# Patient Record
Sex: Male | Born: 1980 | Race: White | Hispanic: No | Marital: Married
Health system: Western US, Academic
[De-identification: ages and names within clinical notes are randomized; demographics above are authoritative.]

---

## 2017-04-09 ENCOUNTER — Ambulatory Visit: Admit: 2017-04-09 | Discharge: 2017-04-09 | Payer: BLUE CROSS/BLUE SHIELD

## 2017-04-09 DIAGNOSIS — Z0000 Encounter for general adult medical examination without abnormal findings: Secondary | ICD-10-CM

## 2017-04-09 DIAGNOSIS — E785 Hyperlipidemia, unspecified: Secondary | ICD-10-CM

## 2017-04-09 LAB — CHOLESTEROL, LDL (INCL. TOT. A
Chol HDL Ratio: 3.2 (ref <6.0)
Cholesterol, HDL: 55 mg/dL (ref >39)
Cholesterol, Total: 178 mg/dL (ref <200)
LDL Cholesterol: 113 mg/dL (ref <130)
Non HDL Cholesterol: 123 mg/dL (ref <160)
Triglycerides, serum: 49 mg/dL (ref <200)

## 2017-04-09 MED ORDER — MULTIVITAMIN TABLET
ORAL | Status: AC
Start: 2017-04-09 — End: ?

## 2017-04-09 NOTE — Patient Instructions (Addendum)
Thank you for coming in to your visit today.      I would like to suggest that you review your instructions carefully and keep a file with all of your medical documents so that you can refer to them later and keep up to date with your care.     Special Instructions: none    You will receive a brief explanation from me within 1 week about any tests that I order and we will have an opportunity to discuss these more fully at your next appointment.     Your feedback allows us to improve. Your positive comments are also very helpful. We appreciate receiving your completed after-visit survey.    Thank you for allowing me to participate in your medical care.     Best wishes,  Cecilia A. Florio MD

## 2017-04-09 NOTE — Progress Notes (Signed)
Looks perfect! Great to meet you.
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
## 2017-04-10 NOTE — Progress Notes (Signed)
Immunization record received from [facility], pt is showing due for Tdap, no record of it, pls see pended, can offer pt a nurse visit appt.     Should I remove this order? Pls advise, Thanks!

## 2017-05-13 MED ORDER — DIPHTH,PERTUSSIS(ACEL),TETANUS 2.5 LF UNIT-8 MCG-5 LF/0.5ML IM SYRINGE
Freq: Once | INTRAMUSCULAR | Status: AC
Start: 2017-05-13 — End: ?

## 2017-10-29 ENCOUNTER — Ambulatory Visit: Admit: 2017-10-29 | Discharge: 2017-10-29 | Payer: BLUE CROSS/BLUE SHIELD

## 2017-10-29 DIAGNOSIS — R079 Chest pain, unspecified: Secondary | ICD-10-CM

## 2017-10-29 NOTE — Telephone Encounter (Signed)
Patient seen in clinic today

## 2017-10-29 NOTE — Progress Notes (Signed)
I performed EKG tracing on the patient during today's visit at [facility]. This documentation is intended to alert coders that the technical portion of EKG tracing was performed at [facility] today.
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
## 2017-11-06 LAB — ECG 12-LEAD
Atrial Rate: 52 {beats}/min
Calculated P Axis: 72 degrees
Calculated R Axis: 72 degrees
Calculated T Axis: 49 degrees
P-R Interval: 142 ms
QRS Duration: 86 ms
QT Interval: 396 ms
QTcb: 368 ms
Ventricular Rate: 52 {beats}/min

## 2017-11-10 ENCOUNTER — Ambulatory Visit: Admit: 2017-11-10 | Discharge: 2017-11-10 | Payer: BLUE CROSS/BLUE SHIELD

## 2017-11-10 DIAGNOSIS — R079 Chest pain, unspecified: Secondary | ICD-10-CM

## 2017-11-10 DIAGNOSIS — R0789 Other chest pain: Secondary | ICD-10-CM
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
## 2017-11-16 LAB — ECG 12-LEAD
Atrial Rate: 60 {beats}/min
Calculated P Axis: 67 degrees
Calculated R Axis: 71 degrees
Calculated T Axis: 50 degrees
P-R Interval: 144 ms
QRS Duration: 90 ms
QT Interval: 390 ms
QTcb: 390 ms
Ventricular Rate: 60 {beats}/min

## 2017-11-16 LAB — ECG STRESS REPORT
Max Diastolic BP: 44 mmHg
Max Heart Rate: 176 {beats}/min
Max Predicted Heart Rate: 185 {beats}/min
Max Work Load (METS*10): 163
Max. Systolic BP: 150 mmHg

## 2019-10-07 IMAGING — CT CTA Coronary (with FFR if indicated)
1 of 18 series · 1 of 18 positions shown, 2 images · IV contrast (omnipaque)
Comparison: None available

CTA Coronary (with FFR if indicated)
INDICATION: Family history of premature coronary heart disease Family history            
 of premature coronary heart disease.                                                      
 Pertinent History: Complaints of left sided chest pain                                    
 Surgical History:                                                                         
 Cancer: None                                                                              
 GFR (past 30 days): >39 ml/min                                                            
 Metformin: None                                                                           
 Smoking status: Never                                                                     
 Intravenous contrast: 100 mL Omnipaque 350                                                
 Technologist Comments: None
TECHNIQUE: Gated computed tomographic angiography; empiric delay for contrast             
 timing; 3D angiographic MIP (maximum intensity projection) reconstructions;               
 sagittal and coronal reformats.                                                           
 Utilized dose reduction techniques include: Automated Exposure Control, vendor            
 specific iterative reconstruction technique

[Series 6: retro ccta bestdiast 77 % · axial · 0.31mm/px · z∈[+1646,+1646]mm · 1 of 466 slices shown, 2 images]
[im 1/466  soft-tissue]
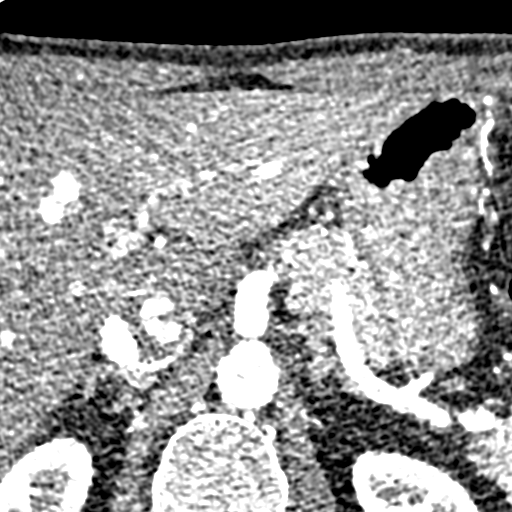
[im 1/466  lung]
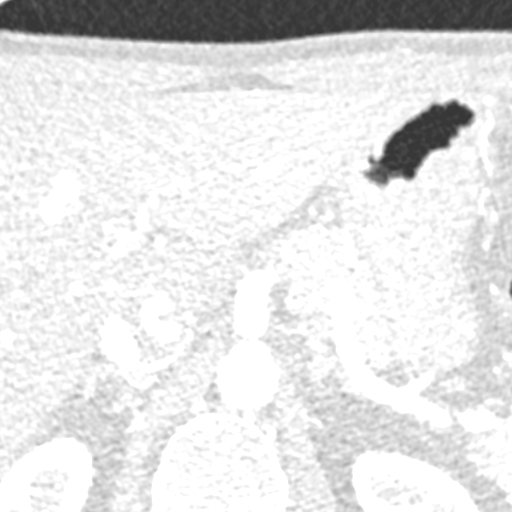

[1 of 18 positions shown; findings below may reference images not displayed]

FINDINGS: The superior and inferior aspects of the chest are incompletely imaged          
 on this coronary CTA.  There is no evidence for aortic aneurysm or aortic                 
 dissection.  No anomalous coronary arteries.  There is a trileaflet aortic                
 valve.  No filling defects are seen within the atria or ventricles.  No                   
 chamber                                                                                   
 enlargement or LVH.  No definite atrial or ventricular septal defects.  There is          
 a conventional pulmonary vein branching pattern.  No pulmonary emboli.  No                
 pulmonary arterial enlargement.  No pleural or pericardial effusion.  No                  
 evidence for pneumothorax.  The visualized portions of the lungs are clear.               
 Suspected to mixing artifact within the inferior vena cava.  The partially                
 imaged celiac artery is a large vessel which could be a developmental variant             
 from combined celiacomesenteric trunk.                                                    
 End-diastolic volume 119 normal range 67-155 mL                                           
 End-systolic volume 51 normal range 22-58 mL                                              
 Stroke-volume 67 normal range 72-110 mL                                                   
 Ejection fraction: 57% normal range 56-78%                                                
 Left main coronary artery: The left anterior descending and left circumflex               
 coronary arteries arise from the left main coronary artery which is free                  
 of                                                                                        
 disease.                                                                                  
 Left anterior descending coronary artery: The left anterior descending is a long          
 vessel wrapping around the cardiac apex to supply the apical inferior wall.               
 There is a right ventricular branch arising from the proximal left anterior               
 descending.  There are two diagonal branches.  The left anterior descending               
 and                                                                                       
 visualized branches are free of disease.                                                  
 Left circumflex coronary artery: The circumflex coronary artery and obtuse                
 marginal branches are free of disease.                                                    
 Right coronary artery: The right coronary artery is dominant.  The posterior              
 descending branch arises from the right coronary artery at the angle of the               
 heart.  There is an acute marginal branch arising from the posterior descending           
 branch at the angle of the heart.  There is a large posterolateral branch                 
 supplying the basal inferior septum.  The right coronary artery and visualized            
 branches are free of disease.
IMPRESSION: Absence of coronary artery disease.
